# Patient Record
Sex: Male | Born: 1955 | Race: White | Hispanic: No | Marital: Married | Smoking: Never smoker
Health system: Southern US, Community
[De-identification: ages and names within clinical notes are randomized; demographics above are authoritative.]

## PROBLEM LIST (undated history)

## (undated) DIAGNOSIS — M5416 Radiculopathy, lumbar region: Secondary | ICD-10-CM

## (undated) HISTORY — PX: PARTIAL HIP ARTHROPLASTY: SHX733

## (undated) HISTORY — DX: Radiculopathy, lumbar region: M54.16

## (undated) HISTORY — PX: TONSILLECTOMY: SUR1361

---

## 2008-09-13 ENCOUNTER — Ambulatory Visit: Payer: Self-pay | Admitting: Family Medicine

## 2008-09-13 DIAGNOSIS — R109 Unspecified abdominal pain: Secondary | ICD-10-CM | POA: Insufficient documentation

## 2008-09-13 DIAGNOSIS — R197 Diarrhea, unspecified: Secondary | ICD-10-CM

## 2008-09-14 ENCOUNTER — Encounter: Payer: Self-pay | Admitting: Family Medicine

## 2008-09-15 LAB — CONVERTED CEMR LAB
Band Neutrophils: 0 % (ref 0–10)
Basophils Absolute: 0 10*3/uL (ref 0.0–0.1)
Basophils Relative: 0 % (ref 0–1)
Hemoglobin: 14.9 g/dL (ref 13.0–17.0)
Lymphocytes Relative: 31 % (ref 12–46)
MCHC: 34.3 g/dL (ref 30.0–36.0)
Neutro Abs: 4.1 10*3/uL (ref 1.7–7.7)
Platelets: 269 10*3/uL (ref 150–400)
WBC: 7.6 10*3/uL (ref 4.0–10.5)

## 2008-09-23 ENCOUNTER — Encounter: Payer: Self-pay | Admitting: Family Medicine

## 2010-01-31 ENCOUNTER — Ambulatory Visit: Payer: Self-pay | Admitting: Family Medicine

## 2010-01-31 DIAGNOSIS — J069 Acute upper respiratory infection, unspecified: Secondary | ICD-10-CM | POA: Insufficient documentation

## 2010-01-31 DIAGNOSIS — K219 Gastro-esophageal reflux disease without esophagitis: Secondary | ICD-10-CM | POA: Insufficient documentation

## 2010-02-01 ENCOUNTER — Encounter: Payer: Self-pay | Admitting: Family Medicine

## 2010-07-29 ENCOUNTER — Encounter: Payer: Self-pay | Admitting: Orthopedic Surgery

## 2010-08-07 NOTE — Letter (Signed)
Summary: Out of Work  [facility]  [address]   [place], [place] [address]   Phone: [phone]  Fax: [phone]    January 31, 2010   Employee:  [employer]    To Whom It May Concern:   For Medical reasons, please excuse the above named employee from work today and tomorrow.  If you need additional information, please feel free to contact our office.         Sincerely,    Donna Christen MD

## 2010-08-07 NOTE — Assessment & Plan Note (Signed)
Summary: SORE THROAT   Vital Signs:  Patient Profile:   55 Years Old Male CC:      sore throat X 1 week,  dry cough Height:     67 inches Weight:      157 pounds O2 Sat:      97 % O2 treatment:    Room Air Temp:     98.2 degrees F oral Pulse rate:   93 / minute Resp:     12 per minute BP sitting:   112 / 73  (right arm) Cuff size:   regular  Vitals Entered By: Lajean Saver RN (January 31, 2010 3:53 PM)                  Updated Prior Medication List: No Medications Current Allergies (reviewed today): No known allergies History of Present Illness Chief Complaint: sore throat X 1 week,  dry cough History of Present Illness:  Subjective: Patient complains of onset of sore throat one week ago followed by sinus congestion.  He developed a cough 3 days ago.  He has a history of GERD and notes that his throat has become more sore since his cough started.  His cough is worse at night.  He normally takes Omeprazole 10mg  once daily or every other day.   No pleuritic pain No wheezing + nasal congestion +  post-nasal drainage No sinus pain/pressure No itchy/red eyes No earache No hemoptysis No SOB No fever/chills No nausea No vomiting No abdominal pain No diarrhea No skin rashes + fatigue No myalgias No headache Used OTC meds without relief   REVIEW OF SYSTEMS Constitutional Symptoms      Denies fever, chills, night sweats, weight loss, weight gain, and fatigue.  Eyes       Denies change in vision, eye pain, eye discharge, glasses, contact lenses, and eye surgery. Ear/Nose/Throat/Mouth       Complains of sore throat.      Denies hearing loss/aids, change in hearing, ear pain, ear discharge, dizziness, frequent runny nose, frequent nose bleeds, sinus problems, hoarseness, and tooth pain or bleeding.  Respiratory       Complains of dry cough.      Denies productive cough, wheezing, shortness of breath, asthma, bronchitis, and emphysema/COPD.  Cardiovascular  Denies murmurs, chest pain, and tires easily with exhertion.    Gastrointestinal       Denies stomach pain, nausea/vomiting, diarrhea, constipation, blood in bowel movements, and indigestion. Genitourniary       Denies painful urination, kidney stones, and loss of urinary control. Neurological       Denies paralysis, seizures, and fainting/blackouts. Musculoskeletal       Denies muscle pain, joint pain, joint stiffness, decreased range of motion, redness, swelling, muscle weakness, and gout.  Skin       Denies bruising, unusual mles/lumps or sores, and hair/skin or nail changes.  Psych       Denies mood changes, temper/anger issues, anxiety/stress, speech problems, depression, and sleep problems.  Past History:  Past Medical History: throat closes spontaneously- undiagnosed, to schedule endoscopy  Past Surgical History: Reviewed history from 09/13/2008 and no changes required. left hip replacement 06/23/07  Family History: Reviewed history from 09/13/2008 and no changes required. mother deceased from heart attack father deceased from cancer  Social History: denies smoking, drinking or recreational drug use Never Smoked Alcohol use-no Drug use-no work in [employer] Status:  never Drug Use:  no   Objective:  Appearance:  Patient appears healthy, stated  age, and in no acute distress  Eyes:  Pupils are equal, round, and reactive to light and accomdation.  Extraocular movement is intact.  Conjunctivae are not inflamed.  Ears:  Canals normal.  Tympanic membranes normal.   Nose:  Normal septum.  Normal turbinates, mildly congested.  No sinus tenderness present.  Pharynx:  Mildly erythematous posteriorly Neck:  Supple.  No adenopathy is present.  No thyromegaly is present  Lungs:  Clear to auscultation.  Breath sounds are equal.  Heart:  Regular rate and rhythm without murmurs, rubs, or gallops.  Abdomen:  Nontender without masses or hepatosplenomegaly.  Bowel sounds are  present.  No CVA or flank tenderness.  Skin:  No rash Rapid strep test negative  Assessment New Problems: GERD (ICD-530.81) URI (ICD-465.9)  VIRAL URI GERD EXACERBATED BY COUGH  Plan New Medications/Changes: AZITHROMYCIN 250 MG TABS (AZITHROMYCIN) Two tabs by mouth on day 1, then 1 tab daily on days 2 through 5  (Rx void after 02/07/10)  #6 tabs x 0, 01/31/2010, Donna Christen MD BENZONATATE 200 MG CAPS (BENZONATATE) One by mouth hs as needed cough  #12 x 0, 01/31/2010, Donna Christen MD  New Orders: Est. Patient Level III [04540] Rapid Strep [98119] T-Culture, Throat [[id number]] Planning Comments:   Throat culture pending Treat symptomatically for now:  expectorant/decongestant, cough suprressant at bedtime, increase dose of omeprazole until URI resolved. Add Z-pack (given Rx to hold) if not improved 5 to 7 days or if fever develops.  Follow-up with PCP  Work/School Excuse: Return to work/school today  The patient and/or caregiver has been counseled thoroughly with regard to medications prescribed including dosage, schedule, interactions, rationale for use, and possible side effects and they verbalize understanding.  Diagnoses and expected course of recovery discussed and will return if not improved as expected or if the condition worsens. Patient and/or caregiver verbalized understanding.  Prescriptions: AZITHROMYCIN 250 MG TABS (AZITHROMYCIN) Two tabs by mouth on day 1, then 1 tab daily on days 2 through 5  (Rx void after 02/07/10)  #6 tabs x 0   Entered and Authorized by:   Donna Christen MD   Signed by:   Donna Christen MD on 01/31/2010   Method used:   Print then Give to Patient   RxID:   [id number] BENZONATATE 200 MG CAPS (BENZONATATE) One by mouth hs as needed cough  #12 x 0   Entered and Authorized by:   Donna Christen MD   Signed by:   Donna Christen MD on 01/31/2010   Method used:   Print then Give to Patient   RxID:   [id number]   Patient Instructions: 1)   May use Mucinex (guaifenesin) twice daily for congestion.  May take with Sudafed (or equivalent) 2)  Increase fluid intake, rest.    3)  May use Afrin nasal spray (or generic oxymetazoline) twice daily for about 5 days.  Also recommend using saline nasal spray several times daily and/or saline nasal irrigation. 4)  Add Azithromycin if not improved 5 to 7 days or fever develops. 5)  Increase Omeprazole 10mg  to twice daily for one week. 6)  Followup with family doctor if not improving 7 to 10 days.  Orders Added: 1)  Est. Patient Level III [44034] 2)  Rapid Strep [74259] 3)  T-Culture, Throat [[id number]]  Laboratory Results  Date/Time Received: January 31, 2010 4:49 PM  Date/Time Reported: January 31, 2010 4:49 PM   Other Tests  Rapid Strep: negative  Kit Test Internal QC: Negative   (  Normal Range: Negative)

## 2010-11-20 NOTE — H&P (Signed)
Robert Murillo, Robert Murillo                  ACCOUNT NO.:  [id number]   MEDICAL RECORD NO.:  [id number]        PATIENT TYPE:  LINP   LOCATION:                               FACILITY:  [facility]   PHYSICIAN:  Georges Lynch. Gioffre, M.D.DATE OF BIRTH:  1956-05-12   DATE OF ADMISSION:  05/28/2007  DATE OF DISCHARGE:                              HISTORY & PHYSICAL   CHIEF COMPLAINT:  Painful range of motion of left hip.   HISTORY OF PRESENT ILLNESS:  The patient is a 55 year old gentleman with  painful range of motion of his left hip.  Evaluation reveals severe  osteoarthritis of his left hip.  The patient has failed conservative  treatment and has elected to proceed with a total hip arthroplasty.   ALLERGIES:  No known drug allergies.   CURRENT MEDICATIONS:  1. Axert 12.5 mg p.r.n. migraines.  2. Etodolac 50 mg p.r.n.  3. Flexor patch p.r.n.  4. Ranitidine 150 mg p.o. daily.  5. Ibuprofen 800 mg p.r.n.  6. Famotidine 20 mg p.o. daily.   PAST MEDICAL HISTORY:  1. History of migraines.  2. GERD.  3. Osteoarthritis, left hip.   PAST SURGICAL HISTORY:  Tonsils without any complications.   PRIMARY CARE PHYSICIAN:  Dr. Marinda Elk   REVIEW OF SYSTEMS:  Negative for any neurologic other than related to  his migraines, and he only has those infrequently.  He denies any  pulmonary, cardiovascular, no GI problems other than related to his  reflux which is fairly well controlled with the ranitidine and  famotidine.  He denies any urinary issues, endocrine, or hematologic.   FAMILY MEDICAL HISTORY:  Mother is deceased from complications from  diabetes and an MI at the age of 67.  Father is deceased from lung  cancer at 87.  There is no smoking history in the family.   SOCIAL HISTORY:  The patient is married.  He is a [employer] manager.  He  has never smoked, no alcohol.  He lives with his wife in a 2-story  house.   PHYSICAL EXAM:  VITALS:  Height is 5 feet 7 inches; weight is 156; blood  pressure  is 118/82; pulse is 70 and regular; respirations 12; the  patient is afebrile.  GENERAL:  This is a healthy-appearing well-developed gentleman,  conscious, alert, and appropriate, ambulates with a fairly easy balanced  gait.  Appears to be in no distress.  HEENT:  Head was normocephalic.  Pupils equal, round, and reactive.  Extraocular movements intact.  Gross hearing is intact.  NECK:  Supple, no palpable lymphadenopathy, excellent range of motion.  CHEST:  Lung sounds were clear and equal bilaterally, and no wheezes,  rales, or rhonchi.  HEART:  Regular rate and rhythm, no murmurs.  ABDOMEN:  Soft, bowel sounds present.  The CVA region was nontender.  EXTREMITIES:  Upper extremities with excellent range of motion of his  shoulders, elbows, and wrists.  Motor strength is 5/5.  LOWER EXTREMITIES:  Right hip had full extension, flexion up to 130  degrees with 20 degrees internal and external rotation.  Left hip  had  full extension, flexion up to about 120 degrees before he had some  discomfort.  He had 0 degrees of internal rotation, about 5 degrees of  external rotation.  Both knees were symmetrical with excellent range of  motion without any discomfort.  Ankles were symmetrical with good range  of motion.  PERIPHERAL VASCULAR:  Carotid pulses were 2+, no bruits.  Radial pulses  were 2+.  Posterior tibial pulses were 2+.  He had no lower extremity  edema.  NEURO:  The patient was conscious, alert, and appropriate, good  historian.  He had no gross neurologic defects noted.  BREASTS/RECTAL/GU:  Deferred at this time.   IMPRESSION:  1. End-stage osteoarthritis, left hip.  2. History of migraines.  3. Gastroesophageal reflux disease.   PLAN:  The patient has been evaluated by his primary care physician and  cleared for this upcoming procedure.  The patient will undergo all  routine labs and tests prior to having a left total hip arthroplasty by  Dr. Darrelyn Hillock at [facility] on  November 20.      Jamelle Rushing, P.A.    ______________________________  Georges Lynch Darrelyn Hillock, M.D.    RWK/MEDQ  D:  05/18/2007  T:  05/18/2007  Job:  [id number]

## 2011-05-01 ENCOUNTER — Other Ambulatory Visit (HOSPITAL_BASED_OUTPATIENT_CLINIC_OR_DEPARTMENT_OTHER): Payer: Self-pay | Admitting: Family Medicine

## 2011-05-01 DIAGNOSIS — N508 Other specified disorders of male genital organs: Secondary | ICD-10-CM

## 2011-05-03 ENCOUNTER — Ambulatory Visit (INDEPENDENT_AMBULATORY_CARE_PROVIDER_SITE_OTHER)
Admission: RE | Admit: 2011-05-03 | Discharge: 2011-05-03 | Disposition: A | Discharge status: Home / Self Care | Payer: 59 | Source: Ambulatory Visit | Attending: Family Medicine | Admitting: Family Medicine

## 2011-05-03 ENCOUNTER — Ambulatory Visit (HOSPITAL_BASED_OUTPATIENT_CLINIC_OR_DEPARTMENT_OTHER)
Admission: RE | Admit: 2011-05-03 | Discharge: 2011-05-03 | Disposition: A | Discharge status: Home / Self Care | Payer: 59 | Source: Ambulatory Visit | Attending: Family Medicine | Admitting: Family Medicine

## 2011-05-03 ENCOUNTER — Other Ambulatory Visit (HOSPITAL_BASED_OUTPATIENT_CLINIC_OR_DEPARTMENT_OTHER): Payer: Self-pay | Admitting: Family Medicine

## 2011-05-03 DIAGNOSIS — N508 Other specified disorders of male genital organs: Secondary | ICD-10-CM

## 2013-05-09 ENCOUNTER — Emergency Department
Admission: EM | Admit: 2013-05-09 | Discharge: 2013-05-09 | Disposition: A | Discharge status: Other Acute Inpatient Hospital | Payer: BC Managed Care – PPO | Source: Home / Self Care

## 2013-05-09 NOTE — ED Notes (Signed)
Patient advised to go to ER b/c cat scratch Friday and is now having erythema streaking up forearm now has generalized chills.

## 2014-06-19 ENCOUNTER — Encounter: Payer: Self-pay | Admitting: *Deleted

## 2014-06-19 ENCOUNTER — Emergency Department
Admission: EM | Admit: 2014-06-19 | Discharge: 2014-06-19 | Disposition: A | Discharge status: Home / Self Care | Payer: BC Managed Care – PPO | Source: Home / Self Care | Attending: Emergency Medicine | Admitting: Emergency Medicine

## 2014-06-19 DIAGNOSIS — M5432 Sciatica, left side: Secondary | ICD-10-CM

## 2014-06-19 MED ORDER — MELOXICAM 7.5 MG PO TABS: ORAL_TABLET | ORAL | Status: DC

## 2014-06-19 MED ORDER — METHYLPREDNISOLONE ACETATE 80 MG/ML IJ SUSP
80.0000 mg | Freq: Once | INTRAMUSCULAR | Status: AC
  Administered 2014-06-19: 80 mg via INTRAMUSCULAR

## 2014-06-19 MED ORDER — HYDROCODONE-ACETAMINOPHEN 5-325 MG PO TABS: 1.0000 | ORAL_TABLET | Freq: Four times a day (QID) | ORAL | Status: DC | PRN

## 2014-06-19 MED ORDER — PREDNISONE 20 MG PO TABS: 20.0000 mg | ORAL_TABLET | Freq: Two times a day (BID) | ORAL | Status: DC

## 2014-06-19 NOTE — ED Provider Notes (Signed)
CSN: [id number]     Arrival date & time 06/19/14  1330 History   First MD Initiated Contact with Patient 06/19/14 1524     Chief Complaint  Patient presents with  . Leg Pain    Patient is a 58 y.o. male presenting with leg pain and back pain. The history is provided by the patient and the spouse.  Leg Pain Associated symptoms: back pain   Associated symptoms: no fever   Back Pain Location:  Gluteal region Quality:  Shooting, stabbing and burning Radiates to:  L knee Pain severity now: 7 out of 10 intensity. Pain is:  Same all the time Onset quality:  Unable to specify Chronicity:  New Context: twisting (or movement)   Context: not falling, not lifting heavy objects, not occupational injury and not recent injury   Ineffective treatments: Motrin. Associated symptoms: leg pain (left)   Associated symptoms: no abdominal pain, no bladder incontinence, no bowel incontinence, no chest pain, no dysuria, no fever, no numbness, no paresthesias, no tingling and no weakness    Pt had a massage 5 days ago and developed severe pain starting in his L hip/butt down to his ankle. He saw a chiropractor 2 days ago and was "adjusted", pain improved then, now worse today. 7/10 intensity  Has tried ice, heat, and motrin with no relief.  He recalls no specific trauma otherwise. History of left hip replacement years ago without sequelae. He states that x-rays of lumbar spine and left hip were taken within the past 2 months and were within normal limits. History reviewed. No pertinent past medical history. Past Surgical History  Procedure Laterality Date  . Partial hip replacemetn     History reviewed. No pertinent family history. History  Substance Use Topics  . Smoking status: Never Smoker   . Smokeless tobacco: Never Used  . Alcohol Use: No    Review of Systems  Constitutional: Negative for fever.  Cardiovascular: Negative for chest pain.  Gastrointestinal: Negative for abdominal pain and  bowel incontinence.  Genitourinary: Negative for bladder incontinence and dysuria.  Musculoskeletal: Positive for back pain.  Neurological: Negative for tingling, weakness, numbness and paresthesias.  All other systems reviewed and are negative.   Allergies  Review of patient's allergies indicates no known allergies.  Home Medications   Prior to Admission medications   Medication Sig Start Date End Date Taking? Authorizing Provider  ibuprofen (ADVIL,MOTRIN) 800 MG tablet Take 800 mg by mouth every 8 (eight) hours as needed.   Yes Historical Provider, MD  HYDROcodone-acetaminophen (NORCO/VICODIN) 5-325 MG per tablet Take 1-2 tablets by mouth every 6 (six) hours as needed for severe pain. Take with food. May cause drowsiness 06/19/14   Lajean Manesavid Massey, MD  meloxicam (MOBIC) 7.5 MG tablet Take 1 twice a day as needed for pain. Take with food. (Do not take with any other NSAID.) 06/19/14   Lajean Manesavid Massey, MD  predniSONE (DELTASONE) 20 MG tablet Take 1 tablet (20 mg total) by mouth 2 (two) times daily with a meal. 06/19/14   Lajean Manesavid Massey, MD   BP 145/98 mmHg  Pulse 96  Temp(Src) 98 F (36.7 C) (Oral)  Ht 5\' 8"  (1.727 m)  Wt 166 lb (75.297 kg)  BMI 25.25 kg/m2  SpO2 97% Physical Exam  Constitutional: He is oriented to person, place, and time. He appears well-developed and well-nourished. He is cooperative.  Non-toxic appearance. No distress.  Appears uncomfortable from L buttock pain  HENT:  Head: Normocephalic and atraumatic.  Mouth/Throat: Oropharynx  is clear and moist.  Eyes: EOM are normal. Pupils are equal, round, and reactive to light. No scleral icterus.  Neck: Neck supple.  Cardiovascular: Regular rhythm and normal heart sounds.   Pulmonary/Chest: Effort normal and breath sounds normal. No respiratory distress. He has no wheezes. He has no rales. He exhibits no tenderness.  Abdominal: Soft. There is no tenderness.  Musculoskeletal:       Right hip: Normal.       Left hip: Normal.        Cervical back: He exhibits no tenderness.       Thoracic back: He exhibits no tenderness.       Lumbar back: He exhibits decreased range of motion, tenderness (Left sciatic notch) and spasm (mild L paralumbar). He exhibits no bony tenderness, no swelling, no edema, no deformity, no laceration and normal pulse.       Back:  Negative Right straight leg-raise test. + Left straight leg-raise test.  Negative Right Luisa HartPatrick test. Negative Left Luisa HartPatrick test.    Neurological: He is alert and oriented to person, place, and time. He has normal strength. He displays no atrophy, no tremor and normal reflexes. No cranial nerve deficit or sensory deficit. He exhibits normal muscle tone. Gait normal.  Reflex Scores:      Patellar reflexes are 2+ on the right side and 2+ on the left side.      Achilles reflexes are 2+ on the right side and 2+ on the left side. Skin: Skin is warm, dry and intact. No lesion and no rash noted.  Psychiatric: He has a normal mood and affect.  Nursing note and vitals reviewed.  left leg: No calf tenderness. Negative Homans sign. Knee is nontender and functions normally. No tenderness  left iliac crest or greater trochanter ED Course  Procedures (including critical care time) Labs Review Labs Reviewed - No data to display  Imaging Review No results found.   MDM   1. Sciatica neuralgia, left    neurologic exam intact. No tenderness over lumbar spinous processes Workup and treatment options discussed. Although I offered, he and wife declined x-ray left hip and LS-spine. After risks, benefits, alternatives discussed, patient wife agreed to the following treatment plan: Depo-Medrol 80 mg IM stat Discharge Medication List as of 06/19/2014  3:31 PM    START taking these medications   Details  HYDROcodone-acetaminophen (NORCO/VICODIN) 5-325 MG per tablet Take 1-2 tablets by mouth every 6 (six) hours as needed for severe pain. Take with food. May cause drowsiness,  Starting 06/19/2014, Until Discontinued, Print    meloxicam (MOBIC) 7.5 MG tablet Take 1 twice a day as needed for pain. Take with food. (Do not take with any other NSAID.), Print    predniSONE (DELTASONE) 20 MG tablet Take 1 tablet (20 mg total) by mouth 2 (two) times daily with a meal., Starting 06/19/2014, Until Discontinued, Print       Heat, ice, other modalities discussed. Follow-up with your ortho in 5-7 days, or sooner if symptoms become worse. Precautions discussed. Red flags discussed.--ER if any red flags. See detailed Instructions in AVS, which were given to patient. Verbal instructions also given. Risks, benefits, and alternatives of treatment options discussed. Questions invited and answered. . Patient and wife voiced understanding and agreement.      Lajean Manesavid Massey, MD 06/19/14 [phone]

## 2014-06-19 NOTE — ED Notes (Signed)
Pt had a massage 5 days ago and developed sever pain starting in his L hip/butt down to his ankle. He saw a chiropractor 2 days ago and was adjusted but the pain has worsened. 10/10.  Has tried ice, heat, and motrin with no relief.

## 2014-06-19 NOTE — Discharge Instructions (Signed)
Sciatica °Sciatica is pain, weakness, numbness, or tingling along your sciatic nerve. The nerve starts in the lower back and runs down the back of each leg. Nerve damage or certain conditions pinch or put pressure on the sciatic nerve. This causes the pain, weakness, and other discomforts of sciatica. °HOME CARE  °· Only take medicine as told by your doctor. °· Apply ice to the affected area for 20 minutes. Do this 3-4 times a day for the first 48-72 hours. Then try heat in the same way. °· Exercise, stretch, or do your usual activities if these do not make your pain worse. °· Go to physical therapy as told by your doctor. °· Keep all doctor visits as told. °· Do not wear high heels or shoes that are not supportive. °· Get a firm mattress if your mattress is too soft to lessen pain and discomfort. °GET HELP RIGHT AWAY IF:  °· You cannot control when you poop (bowel movement) or pee (urinate). °· You have more weakness in your lower back, lower belly (pelvis), butt (buttocks), or legs. °· You have redness or puffiness (swelling) of your back. °· You have a burning feeling when you pee. °· You have pain that gets worse when you lie down. °· You have pain that wakes you from your sleep. °· Your pain is worse than past pain. °· Your pain lasts longer than 4 weeks. °· You are suddenly losing weight without reason. °MAKE SURE YOU:  °· Understand these instructions. °· Will watch this condition. °· Will get help right away if you are not doing well or get worse. °Document Released: 04/02/2008 Document Revised: 12/24/2011 Document Reviewed: 11/03/2011 °Exit[employer]® Patient Information ©2015 [employer]. This information is not intended to replace advice given to you by your health care provider. Make sure you discuss any questions you have with your health care provider. ° °

## 2015-06-17 ENCOUNTER — Emergency Department: Admission: EM | Admit: 2015-06-17 | Discharge: 2015-06-17 | Payer: BLUE CROSS/BLUE SHIELD | Source: Home / Self Care

## 2015-06-21 ENCOUNTER — Other Ambulatory Visit: Payer: Self-pay | Admitting: Physician Assistant

## 2015-06-21 DIAGNOSIS — M5416 Radiculopathy, lumbar region: Secondary | ICD-10-CM

## 2015-06-23 ENCOUNTER — Ambulatory Visit (INDEPENDENT_AMBULATORY_CARE_PROVIDER_SITE_OTHER): Payer: BLUE CROSS/BLUE SHIELD | Admitting: Neurology

## 2015-06-23 ENCOUNTER — Encounter: Payer: Self-pay | Admitting: *Deleted

## 2015-06-23 ENCOUNTER — Encounter: Payer: Self-pay | Admitting: Neurology

## 2015-06-23 VITALS — BP 127/81 | HR 68 | Ht 67.0 in | Wt 158.2 lb

## 2015-06-23 DIAGNOSIS — M5416 Radiculopathy, lumbar region: Secondary | ICD-10-CM | POA: Diagnosis not present

## 2015-06-23 MED ORDER — PREGABALIN 75 MG PO CAPS: 75.0000 mg | ORAL_CAPSULE | Freq: Two times a day (BID) | ORAL | Status: AC

## 2015-06-23 MED ORDER — MELOXICAM 15 MG PO TABS: 15.0000 mg | ORAL_TABLET | Freq: Every day | ORAL | Status: AC

## 2015-06-23 NOTE — Patient Instructions (Signed)
Overall you are doing fairly well but I do want to suggest a few things today:   Remember to drink plenty of fluid, eat healthy meals and do not skip any meals. Try to eat protein with a every meal and eat a healthy snack such as fruit or nuts in between meals. Try to keep a regular sleep-wake schedule and try to exercise daily, particularly in the form of walking, 20-30 minutes a day, if you can.   As far as your medications are concerned, I would like to suggest Start meloxicam daily. Then can add Lyrica. Stop Gabapentin.   As far as diagnostic testing: MRi of the lumbar spine pending  I would like to see you back after imaging if needed, sooner if we need to. Please call us with any interim questions, concerns, problems, updates or refill requests.   Please also call us for any test results so we can go over those with you on the phone.  My clinical assistant and will answer any of your questions and relay your messages to me and also relay most of my messages to you.   Our phone number is [phone]. We also have an after hours call service for urgent matters and there is a physician on-call for urgent questions. For any emergencies you know to call 911 or go to the nearest emergency room

## 2015-06-23 NOTE — Progress Notes (Addendum)
[facility]    Provider:  Dr Lucia GaskinsAhern Referring Provider: Marinda ElkFried, Robert, MD Primary Care Physician:  Lenora BoysFRIED, ROBERT L, MD  CC:  Low back pain  HPI:  Robert Murillo is a 59 y.o. male here as a referral from Dr. Foy GuadalajaraFried for lower back pain and radiculopathy. Past medical history of chronic low back pain with radiculopathy, sciatica, paresthesias of bilateral legs, migraine headaches, degenerative disc disease. Symptoms started years ago. Has been slowly progressive with severe acute increase in the last 3 weeks. 3 weeks ago he could hardly get out of bed. It just hit him. He saw his chiropractor and it made things worse. He has done one sessiion of physical therapy. Tried gabapentin. Tramadol makes him tired. Symptoms are worse in the Left > right leg. Pins and needles in his feet, hard to walk, worse with bending and walking. Radiation down the back of the legs all the ways to the bottom of the feet. His bottom of his feet feel numb and cold. No paresthesias or numbness in the hands. Pain is continuous and severe 10 out of 10, he is walking with a limp. Symptoms worse with sitting too long. Hurts to cough and sneeze. getitng up out of a chair is hard. No changes with bowel/bladder. Starts in the buttocks. If he moves wrong there is a searing pain down the back of his legs. No inciting events, insidious onset progressive. He is scheduled for an MRI of the lumbar spine Monday at 4 PM that was ordered by a different physician. No other focal neurologic deficits. Nothing seems to make the pain better either.   Reviewed notes, labs and imaging from outside physicians, which showed:  Patient was seen in the emergency room in December 2015 for sciatica pain. He described back pain, in the gluteal region, shooting stabbing and burning radiating to the left knee 7 out of 10 in intensity. Motrin was ineffective worsened with twisting or movement. No occupational injury or recent injury. Positive leg  left straight leg raise. He was given the Depo-Medrol 80 mg IM shot. He was started on Norco, meloxicam and a prednisone taper.  Reviewed [facility] physician's notes. Patient was seen 1128 or severe back pain. Pain radiating down both legs for about 2 weeks taking ibuprofen for pain. He was prescribed tramadol and it does nothing that makes him feel tired. Sees chiropractor about 3 times around which helps. Denies bowel or bladder control problems. Pain level VII out of 10 but can be 10 out of 10.  On CBC white blood cells were elevated. CMP was unremarkable with a creatinine of 0.89.  Review of Systems: Patient complains of symptoms per HPI as well as the following symptoms: feeling cold, spinning sensation, joint pain, joint swelling, cramps, aching, numbness, weakness, restless legs. Pertinent negatives per HPI. All others negative.   Social History   Social History  . Marital Status: Married    Spouse Name: Diane  . Number of Children: 0  . Years of Education: 16   Occupational History  . Time [employer]    Social History Main Topics  . Smoking status: Never Smoker   . Smokeless tobacco: Never Used  . Alcohol Use: No  . Drug Use: No  . Sexual Activity: Not on file   Other Topics Concern  . Not on file   Social History Narrative   Lives with wife   2 cups coffee every am    Family History  Problem Relation Age of  Onset  . Lung cancer Father   . Diabetes Mother   . Neuropathy Neg Hx     Past Medical History  Diagnosis Date  . Bilateral lumbar radiculopathy 06/25/2015    Past Surgical History  Procedure Laterality Date  . Partial hip arthroplasty    . Tonsillectomy      Current Outpatient Prescriptions  Medication Sig Dispense Refill  . diclofenac (VOLTAREN) 50 MG EC tablet Take 50 mg by mouth.  1  . ibuprofen (ADVIL,MOTRIN) 800 MG tablet Take 800 mg by mouth every 8 (eight) hours as needed.    . traMADol (ULTRAM) 50 MG tablet Take 50 mg by mouth as needed.  0  .  meloxicam (MOBIC) 15 MG tablet Take 1 tablet (15 mg total) by mouth daily. 30 tablet 6  . pregabalin (LYRICA) 75 MG capsule Take 1 capsule (75 mg total) by mouth 2 (two) times daily. 60 capsule 6   No current facility-administered medications for this visit.    Allergies as of 06/23/2015  . (No Known Allergies)    Vitals: BP 127/81 mmHg  Pulse 68  Ht  (1.702 m)  Wt 158 lb 3.2 oz (71.759 kg)  BMI 24.77 kg/m2 Last Weight:  Wt Readings from Last 1 Encounters:  06/23/15 158 lb 3.2 oz (71.759 kg)   Last Height:   Ht Readings from Last 1 Encounters:  06/23/15  (1.702 m)   Physical exam: Exam: Gen: NAD, conversant, well nourised, obese, well groomed                     CV: RRR, no MRG. No Carotid Bruits. No peripheral edema, warm, nontender Eyes: Conjunctivae clear without exudates or hemorrhage  Neuro: Detailed Neurologic Exam  Speech:    Speech is normal; fluent and spontaneous with normal comprehension.  Cognition:    The patient is oriented to person, place, and time;     recent and remote memory intact;     language fluent;     normal attention, concentration,     fund of knowledge Cranial Nerves:    The pupils are equal, round, and reactive to light. The fundi are normal and spontaneous venous pulsations are present. Visual fields are full to finger confrontation. Extraocular movements are intact. Trigeminal sensation is intact and the muscles of mastication are normal. The face is symmetric. The palate elevates in the midline. Hearing intact. Voice is normal. Shoulder shrug is normal. The tongue has normal motion without fasciculations.   Coordination:    Normal finger to nose and heel to shin. Normal rapid alternating movements.   Gait:    Heel-toe and tandem gait are normal.   Motor Observation:    No asymmetry, no atrophy, and no involuntary movements noted. Tone:    Normal muscle tone.    Posture:    Posture is normal. normal erect     Strength:    Strength is V/V in the upper and lower limbs.      Sensation: decreased pin prick lateral lower extremities.      Reflex Exam:  DTR's: Absent AJS. Asymmetric patellars, left hyporeflexic. othersie deep tendon reflexes in the upper and lower extremities are normal bilaterally.   Toes:    The toes are downgoing bilaterally.   Clonus:    Clonus is absent.       Assessment/Plan:  59 year old patient was likely lumbar radiculopathy. He has an MRI scheduled on Monday.  - If nerve root entrapment is found,  recommend referral to neurosurgery or orthopedic surgery. Continue physical therapy. -If MRI of the lumbar spine is not conclusive, recommend EMG nerve conduction study. He has an MRI of the lumbar spine scheduled on Monday, ordered by different physician.Monday at [facility] med center on [address]. Will request results.  - As far as your medications are concerned, I would like to suggest: Start meloxicam daily. Then can add Lyrica. Stop Gabapentin.  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 Chronic disc, endplate, and posterior element degeneration at those levels. Widespread mild to moderate multifactorial lumbar foraminal stenosis.  L2-L3: Disc desiccation. Circumferential disc osteophyte complex with mild retrolisthesis. Minimal to mild facet hypertrophy. Overall mild to moderate spinal stenosis. Mild left greater than right L2 foraminal stenosis primarily related to foraminal disc.  L3-L4: Disc desiccation and 6 conference showed disc bulge. Broad-based posterior component of disc. Mild facet hypertrophy. Mild spinal stenosis. Mild bilateral L3 foraminal stenosis primarily due to foraminal component of disc.  At L4-L5: Disc dislocation, circumferential disc bulge with left  eccentric broad-based superimposed central disc protrusion. Mild to moderate facet and ligamentum flavum hypertrophy. Severe spinal and left greater than right lateral recess stenosis. Mild-to-moderate bilateral L4 foraminal stenosis  L5-S1: Vacuum disc. Disc space loss. Circumferential disc osteophyte complex. Mild facet hypertrophy. No spinal lateral recess stenosis. Mild to moderate left and mild right L5 foraminal stenosis primarily due to endplate spurring. CC:Dr. Wilfrid Lund, MD  [facility] [address] [place], [place] [address]  Phone [phone] Fax [phone]

## 2015-06-25 ENCOUNTER — Encounter: Payer: Self-pay | Admitting: Neurology

## 2015-06-25 DIAGNOSIS — M5416 Radiculopathy, lumbar region: Secondary | ICD-10-CM

## 2015-06-25 HISTORY — DX: Radiculopathy, lumbar region: M54.16

## 2015-06-26 ENCOUNTER — Ambulatory Visit (INDEPENDENT_AMBULATORY_CARE_PROVIDER_SITE_OTHER): Payer: BLUE CROSS/BLUE SHIELD

## 2015-06-26 DIAGNOSIS — M4806 Spinal stenosis, lumbar region: Secondary | ICD-10-CM | POA: Diagnosis not present

## 2015-06-26 DIAGNOSIS — M5416 Radiculopathy, lumbar region: Secondary | ICD-10-CM

## 2015-06-27 ENCOUNTER — Telehealth: Payer: Self-pay | Admitting: *Deleted

## 2015-06-27 NOTE — Telephone Encounter (Signed)
Release fax to Dr. Andria MeuseStevens requesting MRI report.

## 2015-06-28 NOTE — Telephone Encounter (Signed)
Pt called requesting MRI results. I relayed release was faxed yesterday and provider would call once it was rec'd. Pt sts he will call that office also

## 2015-06-29 ENCOUNTER — Other Ambulatory Visit: Payer: Self-pay | Admitting: *Deleted

## 2015-06-29 ENCOUNTER — Other Ambulatory Visit: Payer: Self-pay | Admitting: Neurology

## 2015-06-29 DIAGNOSIS — M48061 Spinal stenosis, lumbar region without neurogenic claudication: Secondary | ICD-10-CM

## 2015-06-29 NOTE — Telephone Encounter (Signed)
Dr Lucia GaskinsAhern- received results. Put with your notes from today, thank you

## 2015-06-29 NOTE — Telephone Encounter (Signed)
Spoke to patient, please send a referral to Dr. Ethelene Halamos. He has to bring a CD and we can fax the MRI results with the consult thanks

## 2015-07-04 NOTE — Telephone Encounter (Signed)
Noted all information has been sent and I spoke to the patient.

## 2015-07-24 ENCOUNTER — Telehealth: Payer: Self-pay | Admitting: *Deleted

## 2015-07-24 NOTE — Telephone Encounter (Signed)
A  return request from Dr. Viviann SpareSteven office no dates of treatment for this patient.

## 2015-08-24 ENCOUNTER — Emergency Department
Admission: EM | Admit: 2015-08-24 | Discharge: 2015-08-24 | Disposition: A | Discharge status: Home / Self Care | Payer: Managed Care, Other (non HMO) | Source: Home / Self Care | Attending: Family Medicine | Admitting: Family Medicine

## 2015-08-24 ENCOUNTER — Encounter: Payer: Self-pay | Admitting: *Deleted

## 2015-08-24 DIAGNOSIS — J069 Acute upper respiratory infection, unspecified: Secondary | ICD-10-CM | POA: Diagnosis not present

## 2015-08-24 DIAGNOSIS — H9202 Otalgia, left ear: Secondary | ICD-10-CM | POA: Diagnosis not present

## 2015-08-24 DIAGNOSIS — R258 Other abnormal involuntary movements: Secondary | ICD-10-CM | POA: Diagnosis not present

## 2015-08-24 DIAGNOSIS — R253 Fasciculation: Secondary | ICD-10-CM

## 2015-08-24 DIAGNOSIS — J019 Acute sinusitis, unspecified: Secondary | ICD-10-CM | POA: Diagnosis not present

## 2015-08-24 MED ORDER — BENZONATATE 100 MG PO CAPS: 100.0000 mg | ORAL_CAPSULE | Freq: Three times a day (TID) | ORAL | Status: AC

## 2015-08-24 MED ORDER — SALINE SPRAY 0.65 % NA SOLN: 1.0000 | NASAL | Status: AC | PRN

## 2015-08-24 MED ORDER — DOXYCYCLINE HYCLATE 100 MG PO CAPS: 100.0000 mg | ORAL_CAPSULE | Freq: Two times a day (BID) | ORAL | Status: AC

## 2015-08-24 NOTE — ED Notes (Signed)
Pt reports 6 days ago started aches, non-productive cough and fever. T-max 103 4 days ago. Received flu vac. A few nights ago developed arm twitching @ night.

## 2015-08-24 NOTE — ED Provider Notes (Signed)
CSN: [id number]     Arrival date & time 08/24/15  1113 History   First MD Initiated Contact with Patient 08/24/15 1134     Chief Complaint  Patient presents with  . Cough  . Generalized Body Aches   (Consider location/radiation/quality/duration/timing/severity/associated sxs/prior Treatment) HPI The pt is a 60yo male presenting to [facility] with c/o 6 days of URI symptoms with associated body aches, moderately intermittent non-productive cough, fever Tmax 103*F 4 days ago.  He has been taking OTC cough medication and alka-seltzer with only temporary relief. He also notes his upper arms have had intermittent twitching and muscle spasms that occur at night. Symptoms started about 2 days before the URI symptoms.  Others at work have also been sick.  Denies n/v/d. He did receive the flu vaccine this year. Denies chest pain or SOB. No hx of asthma or COPD. He does have a PCP who he f/u with at least annually.   Past Medical History  Diagnosis Date  . Bilateral lumbar radiculopathy 06/25/2015   Past Surgical History  Procedure Laterality Date  . Partial hip arthroplasty    . Tonsillectomy     Family History  Problem Relation Age of Onset  . Lung cancer Father   . Diabetes Mother   . Neuropathy Neg Hx    Social History  Substance Use Topics  . Smoking status: Never Smoker   . Smokeless tobacco: Never Used  . Alcohol Use: No    Review of Systems  Constitutional: Positive for fever, chills and fatigue.  HENT: Positive for congestion, ear pain (bilateral, pressure), hearing loss ( "muffled"), rhinorrhea and sinus pressure. Negative for sore throat, trouble swallowing and voice change.   Respiratory: Positive for cough and wheezing. Negative for shortness of breath.   Cardiovascular: Negative for chest pain and palpitations.  Gastrointestinal: Negative for nausea, vomiting, abdominal pain and diarrhea.  Musculoskeletal: Positive for myalgias and arthralgias. Negative for back pain.  Skin:  Negative for rash.    Allergies  Codeine  Home Medications   Prior to Admission medications   Medication Sig Start Date End Date Taking? Authorizing Provider  benzonatate (TESSALON) 100 MG capsule Take 1 capsule (100 mg total) by mouth every 8 (eight) hours. 08/24/15   Junius Finner, PA-C  diclofenac (VOLTAREN) 50 MG EC tablet Take 50 mg by mouth. 03/29/15   Historical Provider, MD  doxycycline (VIBRAMYCIN) 100 MG capsule Take 1 capsule (100 mg total) by mouth 2 (two) times daily. One po bid x 7 days 08/24/15 08/31/15  Junius Finner, PA-C  ibuprofen (ADVIL,MOTRIN) 800 MG tablet Take 800 mg by mouth every 8 (eight) hours as needed.    Historical Provider, MD  meloxicam (MOBIC) 15 MG tablet Take 1 tablet (15 mg total) by mouth daily. 06/23/15   Anson Fret, MD  pregabalin (LYRICA) 75 MG capsule Take 1 capsule (75 mg total) by mouth 2 (two) times daily. 06/23/15   Anson Fret, MD  sodium chloride (OCEAN) 0.65 % SOLN nasal spray Place 1 spray into both nostrils as needed. 08/24/15   Junius Finner, PA-C  traMADol (ULTRAM) 50 MG tablet Take 50 mg by mouth as needed. 06/05/15   Historical Provider, MD   Meds Ordered and Administered this Visit  Medications - No data to display  BP 137/87 mmHg  Pulse 97  Temp(Src) 98.4 F (36.9 C) (Oral)  Resp 14  Wt 146 lb (66.225 kg)  SpO2 96% No data found.   Physical Exam  Constitutional: He appears  well-developed and well-nourished.  HENT:  Head: Normocephalic and atraumatic.  Right Ear: Tympanic membrane is not erythematous and not bulging. A middle ear effusion is present.  Left Ear: Tympanic membrane is not erythematous and not bulging. A middle ear effusion is present.  Nose: Mucosal edema and rhinorrhea present. Right sinus exhibits maxillary sinus tenderness and frontal sinus tenderness. Left sinus exhibits maxillary sinus tenderness and frontal sinus tenderness.  Mouth/Throat: Uvula is midline and mucous membranes are normal. Posterior  oropharyngeal erythema present. No oropharyngeal exudate, posterior oropharyngeal edema or tonsillar abscesses.  Eyes: Conjunctivae are normal. No scleral icterus.  Neck: Normal range of motion. Neck supple.  Cardiovascular: Normal rate, regular rhythm and normal heart sounds.   Pulmonary/Chest: Effort normal and breath sounds normal. No respiratory distress. He has no wheezes. He has no rales. He exhibits no tenderness.  Intermittent productive cough on exam. No respiratory distress. Lungs: CTAB  Abdominal: Soft. He exhibits no distension. There is no tenderness.  Musculoskeletal: Normal range of motion.  Neurological: He is alert.  Skin: Skin is warm and dry.  Nursing note and vitals reviewed.   ED Course  Procedures (including critical care time)  Labs Review Labs Reviewed  COMPLETE METABOLIC PANEL WITH GFR  CBC WITH DIFFERENTIAL/PLATELET    Imaging Review No results found.    MDM   1. Muscle twitching   2. Acute upper respiratory infection   3. Acute rhinosinusitis   4. Otalgia, left    Pt c/o 1 week of persistent nasal congestion cough, and sinus pressure.   Reports of Temp max 103*F  Pt also c/o muscle twitching that started prior to URI symptoms. No rashes.   Will tx for bacterial sinusitis CBC and BMP pending. Labs ordered to help determine cause of muscle twitching. Advised pt labs should come back in 2-3 days, if normal, f/u with PCP for further workup of muscle twitching.  Advised pt to use acetaminophen and ibuprofen as needed for fever and pain. Encouraged rest and fluids. F/u with PCP in 1 week if not improving, sooner if worsening. Pt verbalized understanding and agreement with tx plan.   Junius Finner, PA-C 08/24/15 1218

## 2015-08-24 NOTE — Discharge Instructions (Signed)
You may take 400-600mg Ibuprofen (Motrin) every 6-8 hours for fever and pain  °Alternate with Tylenol  °You may take 500mg Tylenol every 4-6 hours as needed for fever and pain  °Follow-up with your primary care provider next week for recheck of symptoms if not improving.  °Be sure to drink plenty of fluids and rest, at least 8hrs of sleep a night, preferably more while you are sick. °Return urgent care or go to closest ER if you cannot keep down fluids/signs of dehydration, fever not reducing with Tylenol, difficulty breathing/wheezing, stiff neck, worsening condition, or other concerns (see below)  °Please take antibiotics as prescribed and be sure to complete entire course even if you start to feel better to ensure infection does not come back. ° ° °Cool Mist Vaporizers °Vaporizers may help relieve the symptoms of a cough and cold. They add moisture to the air, which helps mucus to become thinner and less sticky. This makes it easier to breathe and cough up secretions. Cool mist vaporizers do not cause serious burns like hot mist vaporizers, which may also be called steamers or humidifiers. Vaporizers have not been proven to help with colds. You should not use a vaporizer if you are allergic to mold. °HOME CARE INSTRUCTIONS °· Follow the package instructions for the vaporizer. °· Do not use anything other than distilled water in the vaporizer. °· Do not run the vaporizer all of the time. This can cause mold or bacteria to grow in the vaporizer. °· Clean the vaporizer after each time it is used. °· Clean and dry the vaporizer well before storing it. °· Stop using the vaporizer if worsening respiratory symptoms develop. °  °This information is not intended to replace advice given to you by your health care provider. Make sure you discuss any questions you have with your health care provider. °  °Document Released: 03/21/2004 Document Revised: 06/29/2013 Document Reviewed: 11/11/2012 °Elsevier Interactive Patient  Education ©2016 [employer] ° °

## 2015-08-25 ENCOUNTER — Telehealth: Payer: Self-pay | Admitting: *Deleted

## 2015-08-25 LAB — CBC WITH DIFFERENTIAL/PLATELET
Basophils Absolute: 0.1 10*3/uL (ref 0.0–0.1)
Basophils Relative: 1 % (ref 0–1)
Eosinophils Absolute: 0.2 10*3/uL (ref 0.0–0.7)
Eosinophils Relative: 2 % (ref 0–5)
HCT: 43.3 % (ref 39.0–52.0)
Hemoglobin: 15.1 g/dL (ref 13.0–17.0)
Lymphocytes Relative: 20 % (ref 12–46)
Lymphs Abs: 1.7 10*3/uL (ref 0.7–4.0)
MCH: 30.1 pg (ref 26.0–34.0)
MCHC: 34.9 g/dL (ref 30.0–36.0)
MCV: 86.4 fL (ref 78.0–100.0)
MPV: 9.2 fL (ref 8.6–12.4)
Monocytes Absolute: 0.9 10*3/uL (ref 0.1–1.0)
Monocytes Relative: 11 % (ref 3–12)
Neutro Abs: 5.6 10*3/uL (ref 1.7–7.7)
Neutrophils Relative %: 66 % (ref 43–77)
Platelets: 225 10*3/uL (ref 150–400)
RBC: 5.01 MIL/uL (ref 4.22–5.81)
RDW: 13.3 % (ref 11.5–15.5)
WBC: 8.5 10*3/uL (ref 4.0–10.5)

## 2015-08-25 LAB — COMPLETE METABOLIC PANEL WITH GFR
ALT: 25 U/L (ref 9–46)
AST: 24 U/L (ref 10–35)
Albumin: 4.3 g/dL (ref 3.6–5.1)
Alkaline Phosphatase: 29 U/L — ABNORMAL LOW (ref 40–115)
BUN: 19 mg/dL (ref 7–25)
CO2: 24 mmol/L (ref 20–31)
Calcium: 8.9 mg/dL (ref 8.6–10.3)
Chloride: 104 mmol/L (ref 98–110)
Creat: 0.81 mg/dL (ref 0.70–1.33)
GFR, Est African American: >89 mL/min (ref >=60)
GFR, Est Non African American: >89 mL/min (ref >=60)
Glucose, Bld: 100 mg/dL — ABNORMAL HIGH (ref 65–99)
Potassium: 4 mmol/L (ref 3.5–5.3)
Sodium: 141 mmol/L (ref 135–146)
Total Bilirubin: 0.4 mg/dL (ref 0.2–1.2)
Total Protein: 6.7 g/dL (ref 6.1–8.1)

## 2015-12-15 ENCOUNTER — Encounter: Payer: Self-pay | Admitting: Emergency Medicine

## 2015-12-15 ENCOUNTER — Emergency Department
Admission: EM | Admit: 2015-12-15 | Discharge: 2015-12-15 | Disposition: A | Discharge status: Other Acute Inpatient Hospital | Payer: Managed Care, Other (non HMO) | Source: Home / Self Care | Attending: Family Medicine | Admitting: Family Medicine

## 2015-12-15 DIAGNOSIS — R42 Dizziness and giddiness: Secondary | ICD-10-CM

## 2015-12-15 DIAGNOSIS — R509 Fever, unspecified: Secondary | ICD-10-CM | POA: Diagnosis not present

## 2015-12-15 DIAGNOSIS — R51 Headache: Secondary | ICD-10-CM

## 2015-12-15 DIAGNOSIS — R519 Headache, unspecified: Secondary | ICD-10-CM

## 2015-12-15 LAB — POCT URINALYSIS DIP (MANUAL ENTRY)
Bilirubin, UA: NEGATIVE
Glucose, UA: 250 — AB
Ketones, POC UA: NEGATIVE
Leukocytes, UA: NEGATIVE
NITRITE UA: NEGATIVE
Protein Ur, POC: 100 — AB
SPEC GRAV UA: 1.025 (ref 1.005–1.03)
UROBILINOGEN UA: 0.2 (ref 0–1)
pH, UA: 6 (ref 5–8)

## 2015-12-15 LAB — POCT FASTING CBG KUC MANUAL ENTRY: POCT GLUCOSE (MANUAL ENTRY) KUC: 163 mg/dL — AB (ref 70–99)

## 2015-12-15 MED ORDER — ONDANSETRON HCL 4 MG PO TABS
4.0000 mg | ORAL_TABLET | Freq: Once | ORAL | Status: AC
  Administered 2015-12-15: 4 mg via ORAL

## 2015-12-15 MED ORDER — IBUPROFEN 600 MG PO TABS
600.0000 mg | ORAL_TABLET | Freq: Once | ORAL | Status: AC
  Administered 2015-12-15: 600 mg via ORAL

## 2015-12-15 NOTE — ED Provider Notes (Signed)
CSN: [id number]     Arrival date & time 12/15/15  1609 History   First MD Initiated Contact with Patient 12/15/15 1641     Chief Complaint  Patient presents with  . Dizziness  . Generalized Body Aches      HPI Comments: Yesterday morning patient began to feel fatigued, later becoming dizzy (head spinning).  He became increasingly fatigued through the night, developing myalgias, chills, posterior headache, and nausea without vomiting.  No respiratory or GU symptoms.  No diarrhea.  The history is provided by the patient and the spouse.    Past Medical History  Diagnosis Date  . Bilateral lumbar radiculopathy 06/25/2015   Past Surgical History  Procedure Laterality Date  . Partial hip arthroplasty    . Tonsillectomy     Family History  Problem Relation Age of Onset  . Lung cancer Father   . Diabetes Mother   . Neuropathy Neg Hx    Social History  Substance Use Topics  . Smoking status: Never Smoker   . Smokeless tobacco: Never Used  . Alcohol Use: No    Review of Systems No sore throat No cough No pleuritic pain No wheezing No nasal congestion No post-nasal drainage No sinus pain/pressure No itchy/red eyes No earache + dizzy No hemoptysis No SOB ? fever, + chills + nausea No vomiting No abdominal pain No diarrhea No urinary symptoms No skin rash + fatigue + myalgias + headache Used OTC meds without relief  Allergies  Codeine  Home Medications   Prior to Admission medications   Medication Sig Start Date End Date Taking? Authorizing Provider  benzonatate (TESSALON) 100 MG capsule Take 1 capsule (100 mg total) by mouth every 8 (eight) hours. 08/24/15   Junius FinnerErin O'Malley, PA-C  diclofenac (VOLTAREN) 50 MG EC tablet Take 50 mg by mouth. 03/29/15   Historical Provider, MD  ibuprofen (ADVIL,MOTRIN) 800 MG tablet Take 800 mg by mouth every 8 (eight) hours as needed.    Historical Provider, MD  meloxicam (MOBIC) 15 MG tablet Take 1 tablet (15 mg total) by mouth  daily. 06/23/15   Anson FretAntonia B Ahern, MD  pregabalin (LYRICA) 75 MG capsule Take 1 capsule (75 mg total) by mouth 2 (two) times daily. 06/23/15   Anson FretAntonia B Ahern, MD  sodium chloride (OCEAN) 0.65 % SOLN nasal spray Place 1 spray into both nostrils as needed. 08/24/15   Junius FinnerErin O'Malley, PA-C  traMADol (ULTRAM) 50 MG tablet Take 50 mg by mouth as needed. 06/05/15   Historical Provider, MD   Meds Ordered and Administered this Visit   Medications  ibuprofen (ADVIL,MOTRIN) tablet 600 mg (600 mg Oral Given 12/15/15 1700)  ondansetron (ZOFRAN) tablet 4 mg (4 mg Oral Given 12/15/15 1710)    BP 108/63 mmHg  Pulse 107  Temp(Src) 102.9 F (39.4 C) (Oral)  Ht 5\' 7"  (1.702 m)  Wt 155 lb (70.308 kg)  BMI 24.27 kg/m2  SpO2 94% No data found.   Physical Exam Nursing notes and Vital Signs reviewed. Appearance:  Patient appears stated age.  He appears uncomfortable but in no acute distress.  He is alert and oriented.  Eyes:  Pupils are equal, round, and reactive to light and accomodation.  Extraocular movement is intact.  Conjunctivae are not inflamed.  Fundi benign.  No nystagmus  Ears:  Canals normal.  Tympanic membranes normal.  Nose:  Normal turbinates.  No sinus tenderness.    Pharynx:  Normal; moist mucous membranes  Neck:  Tenderness posterior neck and occipital  area; has pain with neck flexion.  No adenopathy Lungs:  Clear to auscultation.  Breath sounds are equal.  Moving air well. Heart:  Regular rate and rhythm without murmurs, rubs, or gallops.  Rate 107 Abdomen:  Nontender without masses or hepatosplenomegaly.  Bowel sounds are present.  No CVA or flank tenderness.  Extremities:  No edema.  Skin:  No rash present. Neurologic:  Cranial nerves 2 through 12 are normal.  Patellar, achilles, and elbow reflexes are normal.  Cerebellar function is intact (finger-to-nose and rapid alternating hand movement).    ED Course  Procedures none    Labs Reviewed  POCT URINALYSIS DIP (MANUAL ENTRY) -  Abnormal; Notable for the following:    Glucose, UA =250 (*)    Blood, UA trace-intact (*)    Protein Ur, POC =100 (*)    All other components within normal limits  POCT FASTING CBG K[facility] MANUAL ENTRY - Abnormal; Notable for the following:    POCT Glucose ([facility]) 163 (*)    All other components within normal limits POCT CBC:  WBC 21.7; LY 1.7; MO 0.9; GR 97.4; Hgb 14.2; Platelets 181      MDM   1. Vertigo   2. Occipital headache   3. Fever in adult    Note significant leukocytosis and fever.  Concern for possible meningitis. Advised to proceed to local ER for further evaluation.  Vital signs stable.  May proceed by private vehicle (wife to drive).    Lattie Haw, MD 12/23/15 [phone]

## 2015-12-15 NOTE — Discharge Instructions (Signed)

## 2015-12-15 NOTE — ED Notes (Signed)
Reports starting to feel dizzy and weak last evening, progressing over the night to include extreme fatigue. Denies respiratory or gastrointestinal symptoms.

## 2015-12-16 ENCOUNTER — Telehealth: Payer: Self-pay | Admitting: Emergency Medicine

## 2016-01-09 ENCOUNTER — Emergency Department (HOSPITAL_COMMUNITY)
Admission: EM | Admit: 2016-01-09 | Discharge: 2016-01-09 | Disposition: A | Discharge status: Home / Self Care | Payer: Managed Care, Other (non HMO) | Attending: Emergency Medicine | Admitting: Emergency Medicine

## 2016-01-09 ENCOUNTER — Encounter (HOSPITAL_COMMUNITY): Payer: Self-pay | Admitting: Emergency Medicine

## 2016-01-09 DIAGNOSIS — M4800 Spinal stenosis, site unspecified: Secondary | ICD-10-CM

## 2016-01-09 DIAGNOSIS — M5441 Lumbago with sciatica, right side: Secondary | ICD-10-CM

## 2016-01-09 DIAGNOSIS — M549 Dorsalgia, unspecified: Secondary | ICD-10-CM | POA: Diagnosis present

## 2016-01-09 DIAGNOSIS — Z79899 Other long term (current) drug therapy: Secondary | ICD-10-CM | POA: Insufficient documentation

## 2016-01-09 DIAGNOSIS — E871 Hypo-osmolality and hyponatremia: Secondary | ICD-10-CM

## 2016-01-09 LAB — CBC WITH DIFFERENTIAL/PLATELET
BASOS ABS: 0.1 10*3/uL (ref 0.0–0.1)
Basophils Relative: 1 %
Eosinophils Absolute: 0.2 10*3/uL (ref 0.0–0.7)
Eosinophils Relative: 1 %
HCT: 36 % — ABNORMAL LOW (ref 39.0–52.0)
Hemoglobin: 11.8 g/dL — ABNORMAL LOW (ref 13.0–17.0)
Lymphocytes Relative: 10 %
Lymphs Abs: 1.1 10*3/uL (ref 0.7–4.0)
MCH: 28.6 pg (ref 26.0–34.0)
MCHC: 32.8 g/dL (ref 30.0–36.0)
MCV: 87.2 fL (ref 78.0–100.0)
MONO ABS: 1.1 10*3/uL — AB (ref 0.1–1.0)
Monocytes Relative: 10 %
NEUTROS ABS: 8.6 10*3/uL — AB (ref 1.7–7.7)
Neutrophils Relative %: 78 %
Platelets: 270 10*3/uL (ref 150–400)
RBC: 4.13 MIL/uL — AB (ref 4.22–5.81)
RDW: 12.3 % (ref 11.5–15.5)
WBC: 11 10*3/uL — ABNORMAL HIGH (ref 4.0–10.5)

## 2016-01-09 LAB — COMPREHENSIVE METABOLIC PANEL
ALK PHOS: 36 U/L — AB (ref 38–126)
ALT: 20 U/L (ref 17–63)
AST: 35 U/L (ref 15–41)
Albumin: 3.3 g/dL — ABNORMAL LOW (ref 3.5–5.0)
Anion gap: 9 (ref 5–15)
BILIRUBIN TOTAL: 0.6 mg/dL (ref 0.3–1.2)
BUN: 14 mg/dL (ref 6–20)
CALCIUM: 8.8 mg/dL — AB (ref 8.9–10.3)
CO2: 23 mmol/L (ref 22–32)
CREATININE: 0.83 mg/dL (ref 0.61–1.24)
Chloride: 96 mmol/L — ABNORMAL LOW (ref 101–111)
Glucose, Bld: 109 mg/dL — ABNORMAL HIGH (ref 65–99)
Potassium: 3.9 mmol/L (ref 3.5–5.1)
SODIUM: 128 mmol/L — AB (ref 135–145)
TOTAL PROTEIN: 7 g/dL (ref 6.5–8.1)

## 2016-01-09 LAB — SEDIMENTATION RATE: Sed Rate: 61 mm/hr — ABNORMAL HIGH (ref 0–16)

## 2016-01-09 LAB — C-REACTIVE PROTEIN: CRP: 22.8 mg/dL — AB (ref <1.0)

## 2016-01-09 LAB — I-STAT CG4 LACTIC ACID, ED: Lactic Acid, Venous: 0.89 mmol/L (ref 0.5–1.9)

## 2016-01-09 MED ORDER — KETOROLAC TROMETHAMINE 60 MG/2ML IM SOLN
30.0000 mg | Freq: Once | INTRAMUSCULAR | Status: AC
  Administered 2016-01-09: 30 mg via INTRAMUSCULAR
  Filled 2016-01-09: qty 2

## 2016-01-09 MED ORDER — OXYCODONE-ACETAMINOPHEN 5-325 MG PO TABS: 1.0000 | ORAL_TABLET | ORAL | Status: AC | PRN

## 2016-01-09 MED ORDER — OXYCODONE-ACETAMINOPHEN 5-325 MG PO TABS
1.0000 | ORAL_TABLET | Freq: Once | ORAL | Status: AC
  Administered 2016-01-09: 1 via ORAL
  Filled 2016-01-09: qty 1

## 2016-01-09 MED ORDER — DIAZEPAM 5 MG PO TABS
5.0000 mg | ORAL_TABLET | Freq: Once | ORAL | Status: AC
  Administered 2016-01-09: 5 mg via ORAL
  Filled 2016-01-09: qty 1

## 2016-01-09 NOTE — Discharge Instructions (Signed)
Continue to take Flexeril for muscle spasms. Take Percocet for severe pain only. Try heating pads. Please follow-up with neurosurgery, as referred. Follow-up with family doctor for fevers. Return if worsening symptoms.   Degenerative Disk Disease Degenerative disk disease is a condition caused by the changes that occur in spinal disks as you grow older. Spinal disks are soft and compressible disks located between the bones of your spine (vertebrae). These disks act like shock absorbers. Degenerative disk disease can affect the whole spine. However, the neck and lower back are most commonly affected. Many changes can occur in the spinal disks with aging, such as:  The spinal disks may dry and shrink.  Small tears may occur in the tough, outer covering of the disk (annulus).  The disk space may become smaller due to loss of water.  Abnormal growths in the bone (spurs) may occur. This can put pressure on the nerve roots exiting the spinal canal, causing pain.  The spinal canal may become narrowed. RISK FACTORS   Being overweight.  Having a family history of degenerative disk disease.  Smoking.  There is increased risk if you are doing heavy lifting or have a sudden injury. SIGNS AND SYMPTOMS  Symptoms vary from person to person and may include:  Pain that varies in intensity. Some people have no pain, while others have severe pain. The location of the pain depends on the part of your backbone that is affected.  You will have neck or arm pain if a disk in the neck area is affected.  You will have pain in your back, buttocks, or legs if a disk in the lower back is affected.  Pain that becomes worse while bending, reaching up, or with twisting movements.  Pain that may start gradually and then get worse as time passes. It may also start after a major or minor injury.  Numbness or tingling in the arms or legs. DIAGNOSIS  Your health care provider will ask you about your symptoms and  about activities or habits that may cause the pain. He or she may also ask about any injuries, diseases, or treatments you have had. Your health care provider will examine you to check for the range of movement that is possible in the affected area, to check for strength in your extremities, and to check for sensation in the areas of the arms and legs supplied by different nerve roots. You may also have:   An X-ray of the spine.  Other imaging tests, such as MRI. TREATMENT  Your health care provider will advise you on the best plan for treatment. Treatment may include:  Medicines.  Rehabilitation exercises. HOME CARE INSTRUCTIONS   Follow proper lifting and walking techniques as advised by your health care provider.  Maintain good posture.  Exercise regularly as advised by your health care provider.  Perform relaxation exercises.  Change your sitting, standing, and sleeping habits as advised by your health care provider.  Change positions frequently.  Lose weight or maintain a healthy weight as advised by your health care provider.  Do not use any tobacco products, including cigarettes, chewing tobacco, or electronic cigarettes. If you need help quitting, ask your health care provider.  Wear supportive footwear.  Take medicines only as directed by your health care provider. SEEK MEDICAL CARE IF:   Your pain does not go away within 1-4 weeks.  You have significant appetite or weight loss. SEEK IMMEDIATE MEDICAL CARE IF:   Your pain is severe.  You  notice weakness in your arms, hands, or legs.  You begin to lose control of your bladder or bowel movements.  You have fevers or night sweats. MAKE SURE YOU:   Understand these instructions.  Will watch your condition.  Will get help right away if you are not doing well or get worse.   This information is not intended to replace advice given to you by your health care provider. Make sure you discuss any questions you  have with your health care provider.   Document Released: 04/21/2007 Document Revised: 07/15/2014 Document Reviewed: 10/26/2013 Elsevier Interactive Patient Education [employer]2016 [employer] Inc.

## 2016-01-09 NOTE — ED Notes (Signed)
Patient denies any change in Bowel or bladder with back pain. States was in Hospital and the Hospital beds caused his back to spasm.

## 2016-01-09 NOTE — ED Provider Notes (Signed)
CSN: [id number]     Arrival date & time 01/09/16  1332 History   First MD Initiated Contact with Patient 01/09/16 1343     Chief Complaint  Patient presents with  . Back Pain     (Consider location/radiation/quality/duration/timing/severity/associated sxs/prior Treatment) HPI Robert Murillo is a 60 y.o. male with hx of chronic back pain and recent admission for staph bacteremia, just finished course of IV antibiotics last week, presents to ED with worsening back pain. Pt states he has chronic back issues, states while in the hospital he developed worsening back pain. He had extensive work up for his illness at that time to find the source of bacteremia. He had CT chest and abdomen and pelvis, as well as MR cervical, thoracic, lumbar spine which showed no source of infection. His MRI did show severe spinal stenosis. He is currently taking flexeril and tramadol for his pain which he states is not helping. Patient's pain is in the lower back, and shoots down bilateral legs, right worse than left. He denies any trouble urinating or controlling his bladder or bowels. He denies any abdominal pain. He denies any numbness in his legs. He states his legs do get weak sometimes. States pain is constant, but gets severe pain that "shoots in lower back and sometimes into a leg."   Past Medical History  Diagnosis Date  . Bilateral lumbar radiculopathy 06/25/2015   Past Surgical History  Procedure Laterality Date  . Partial hip arthroplasty    . Tonsillectomy     Family History  Problem Relation Age of Onset  . Lung cancer Father   . Diabetes Mother   . Neuropathy Neg Hx    Social History  Substance Use Topics  . Smoking status: Never Smoker   . Smokeless tobacco: Never Used  . Alcohol Use: No    Review of Systems  Constitutional: Positive for fever. Negative for chills.  Respiratory: Negative for cough, chest tightness and shortness of breath.   Cardiovascular: Negative for chest pain,  palpitations and leg swelling.  Gastrointestinal: Negative for nausea, vomiting, abdominal pain, diarrhea and abdominal distention.  Genitourinary: Negative for dysuria, urgency, frequency and hematuria.  Musculoskeletal: Positive for back pain and arthralgias. Negative for myalgias, neck pain and neck stiffness.  Skin: Negative for rash.  Allergic/Immunologic: Negative for immunocompromised state.  Neurological: Negative for dizziness, weakness, light-headedness, numbness and headaches.  All other systems reviewed and are negative.     Allergies  Codeine and Hydrocodone  Home Medications   Prior to Admission medications   Medication Sig Start Date End Date Taking? Authorizing Provider  acetaminophen (TYLENOL) 325 MG tablet Take 325 mg by mouth every 6 (six) hours as needed. pain   Yes Historical Provider, MD  cyclobenzaprine (FLEXERIL) 10 MG tablet Take 10 mg by mouth 3 (three) times daily. 01/03/16  Yes Historical Provider, MD  ibuprofen (ADVIL,MOTRIN) 800 MG tablet Take 800 mg by mouth every 8 (eight) hours as needed for moderate pain.    Yes Historical Provider, MD  Multiple Vitamins-Minerals (MULTIVITAMIN MEN) TABS Take 1 tablet by mouth daily.   Yes Historical Provider, MD  sodium chloride (OCEAN) 0.65 % SOLN nasal spray Place 1 spray into both nostrils as needed. Patient taking differently: Place 1 spray into both nostrils as needed for congestion.  08/24/15  Yes Junius FinnerErin O'Malley, PA-C  traMADol (ULTRAM) 50 MG tablet Take 50 mg by mouth every 6 (six) hours as needed for moderate pain.  06/05/15  Yes Historical Provider, MD  benzonatate (TESSALON) 100 MG capsule Take 1 capsule (100 mg total) by mouth every 8 (eight) hours. Patient not taking: Reported on 01/09/2016 08/24/15   Junius Finner, PA-C  meloxicam (MOBIC) 15 MG tablet Take 1 tablet (15 mg total) by mouth daily. Patient not taking: Reported on 01/09/2016 06/23/15   Anson Fret, MD  pregabalin (LYRICA) 75 MG capsule Take 1  capsule (75 mg total) by mouth 2 (two) times daily. Patient not taking: Reported on 01/09/2016 06/23/15   Anson Fret, MD   BP 98/75 mmHg  Pulse 105  Temp(Src) 98.4 F (36.9 C) (Oral)  Resp 16  Ht  (1.702 m)  Wt 67.586 kg  BMI 23.33 kg/m2  SpO2 95% Physical Exam  Constitutional: He appears well-developed and well-nourished. No distress.  HENT:  Head: Normocephalic and atraumatic.  Eyes: Conjunctivae are normal.  Neck: Neck supple.  Cardiovascular: Normal rate, regular rhythm and normal heart sounds.   Pulmonary/Chest: Effort normal. No respiratory distress. He has no wheezes. He has no rales.  Abdominal: Soft. Bowel sounds are normal. He exhibits no distension. There is no tenderness. There is no rebound.  Musculoskeletal: He exhibits no edema.  ttp over  Midline lumbar spine and right perispinal lumbar muscles. Pain with bilateral straight leg raise.   Neurological: He is alert.  5/5 and equal lower extremity strength. 2+ and equal patellar reflexes bilaterally. Pt able to dorsiflex bilateral toes and feet with good strength against resistance. Equal sensation bilaterally over thighs and lower legs.   Skin: Skin is warm and dry.  Nursing note and vitals reviewed.   ED Course  Procedures (including critical care time) Labs Review Labs Reviewed  CBC WITH DIFFERENTIAL/PLATELET - Abnormal; Notable for the following:    WBC 11.0 (*)    RBC 4.13 (*)    Hemoglobin 11.8 (*)    HCT 36.0 (*)    Neutro Abs 8.6 (*)    Monocytes Absolute 1.1 (*)    All other components within normal limits  SEDIMENTATION RATE - Abnormal; Notable for the following:    Sed Rate 61 (*)    All other components within normal limits  COMPREHENSIVE METABOLIC PANEL - Abnormal; Notable for the following:    Sodium 128 (*)    Chloride 96 (*)    Glucose, Bld 109 (*)    Calcium 8.8 (*)    Albumin 3.3 (*)    Alkaline Phosphatase 36 (*)    All other components within normal limits  C-REACTIVE  PROTEIN - Abnormal; Notable for the following:    CRP 22.8 (*)    All other components within normal limits  CULTURE, BLOOD (ROUTINE X 2)  CULTURE, BLOOD (ROUTINE X 2)  I-STAT CG4 LACTIC ACID, ED    Imaging Review No results found. I have personally reviewed and evaluated these images and lab results as part of my medical decision-making.   EKG Interpretation None      MDM   Final diagnoses:  Spinal stenosis, unspecified spinal region  Right-sided low back pain with sciatica, sciatica laterality unspecified  Hyponatremia    Pt in ED with increased back pain. Recent admission for bacterimia, at that time same pain in back and fever, blood cultures positive for staph. Finished IV antibtiocs last week, still intermittent fevers. Afebrile here. Records in Care everywhere reviewed. Pt had CT abd/pelvis/chest which was negative. Also MRI of cervical, thoracic, lumbar spine which showed severe spinal stenosis and disk disease, no evidence of infection. Same pain as  then, doubt back pain as source of infection given all this imaging. Pt has no other complaints. Will check labs and treat pain. No signs of cauda equina at this time.   4:24 PM Labs show hyponatremia, NA of 128. Hgb 11.8, which is up from hgb few weeks ago which was as long as 9 while admitted through care everywhere records. WBC improved as well. Sed rate elevated at 61, up from the one during admission, however, CRP much improved at 22.8 where it was 129 during admission. Pt feels better with tx in ED. He is afebrile. Normal VS. No evidence of infectious process based on exam and VS. Stable for dc home at this time. He has an apt with infectious disease coming up and advised him to follow up with PCP. Also will refer to neurosurgery. Return precautions discussed.   Filed Vitals:   01/09/16 1339 01/09/16 1415 01/09/16 1430 01/09/16 1445  BP: 110/72 98/75 113/68 126/75  Pulse: 107 105 102 95  Temp: 98.4 F (36.9 C)      TempSrc: Oral     Resp: 16     Height: 5\' 7"  (1.702 m)     Weight: 67.586 kg     SpO2: 96% 95% 95% 95%     Jaynie Crumbleatyana Hiren Peplinski, PA-C 01/09/16 1634  Laurence Spatesachel Morgan Little, MD 01/15/16 0011

## 2016-01-09 NOTE — ED Notes (Signed)
The pt reports that he is pain free at present

## 2016-01-09 NOTE — ED Notes (Signed)
Pt here with mid/lower back pain since being hospitalized [place] ago in [place] for [place]acte[place]. Pt reports tramadol and flexiril not helping. Pt sts he is having gait instability and increasing weakness. Pt denies loss of control of bowels or bladder,

## 2016-01-10 ENCOUNTER — Telehealth (HOSPITAL_BASED_OUTPATIENT_CLINIC_OR_DEPARTMENT_OTHER): Payer: Self-pay | Admitting: *Deleted

## 2016-01-10 ENCOUNTER — Telehealth (HOSPITAL_BASED_OUTPATIENT_CLINIC_OR_DEPARTMENT_OTHER): Payer: Self-pay | Admitting: Emergency Medicine

## 2016-01-10 LAB — BLOOD CULTURE ID PANEL (REFLEXED)
Acinetobacter baumannii: NOT DETECTED
Candida albicans: NOT DETECTED
Candida glabrata: NOT DETECTED
Candida krusei: NOT DETECTED
Candida parapsilosis: NOT DETECTED
Candida tropicalis: NOT DETECTED
Carbapenem resistance: NOT DETECTED
Enterobacter cloacae complex: NOT DETECTED
Enterobacteriaceae species: NOT DETECTED
Enterococcus species: NOT DETECTED
Escherichia coli: NOT DETECTED
Haemophilus influenzae: NOT DETECTED
Klebsiella oxytoca: NOT DETECTED
Klebsiella pneumoniae: NOT DETECTED
Listeria monocytogenes: NOT DETECTED
Methicillin resistance: NOT DETECTED
Neisseria meningitidis: NOT DETECTED
Proteus species: NOT DETECTED
Pseudomonas aeruginosa: NOT DETECTED
Serratia marcescens: NOT DETECTED
Staphylococcus aureus (BCID): DETECTED — AB
Staphylococcus species: DETECTED — AB
Streptococcus agalactiae: NOT DETECTED
Streptococcus pneumoniae: NOT DETECTED
Streptococcus pyogenes: NOT DETECTED
Streptococcus species: NOT DETECTED
Vancomycin resistance: NOT DETECTED

## 2016-01-10 NOTE — Telephone Encounter (Signed)
(+)   blood culture results called to patient with recommendation from Althea GrimmerSamantha Riley, PA to return to ED for further evaluation.  States he will contact Dr. Weston AnnaMichael Arapian [phone]) who has been treating him for prior infection to inform of (+) blood culture and will seek treatment based on Dr. Peggye FothergillArapian's advise.

## 2016-01-10 NOTE — Telephone Encounter (Signed)
Chart handoff to EDP Althea GrimmerSamantha Riley PA, instructed to call patient to return to ER for reeval

## 2016-01-12 LAB — CULTURE, BLOOD (ROUTINE X 2)

## 2016-01-13 ENCOUNTER — Telehealth (HOSPITAL_BASED_OUTPATIENT_CLINIC_OR_DEPARTMENT_OTHER): Payer: Self-pay

## 2016-01-13 NOTE — Telephone Encounter (Signed)
Post ED Visit - Positive Culture Follow-up  Culture report reviewed by antimicrobial stewardship pharmacist:  []  Enzo BiNathan Batchelder, Pharm.D. []  Celedonio MiyamotoJeremy Frens, Pharm.D., BCPS []  Garvin FilaMike Maccia, Pharm.D. []  Georgina PillionElizabeth Martin, Pharm.D., BCPS []  St. MichaelMinh Pham, [address]Ph[address]D., BCPS, AAHIVP []  Estella HuskMichelle Turner, Pharm.D., BCPS, AAHIVP []  Tennis Mustassie Stewart, Pharm.D. []  Sherle Poeob Vincent, [address]Ph[address]Novella Rob. Jennifer Marple Pharm D Positive blood culture Already notified on 01/10/16 and no further patient follow-up is required at this time.  Jerry CarasCullom, Hennie Gosa Burnett 01/13/2016, 10:43 AM

## 2017-01-02 IMAGING — MR MR LUMBAR SPINE W/O CM
4 of 5 series · 24 of 48 positions shown · non-contrast
Comparison: Left hip series demonstrating arthroplasty changes
04/13/2014.

CLINICAL DATA: 59-year-old male with lumbar back pain radiating to
both lower extremities as far as the feet. Numbness and tingling.
Symptoms increased with standing, and progressed for the past 3
weeks. Initial encounter.

EXAM:
MRI LUMBAR SPINE WITHOUT CONTRAST
TECHNIQUE: Multiplanar, multisequence MR imaging of the lumbar spine was
performed. No intravenous contrast was administered.

[Series 2: T2 · sagittal · 4.0mm · 0.81mm/px · 7 of 15 slices shown (1 of 2)]
[im 1/15]
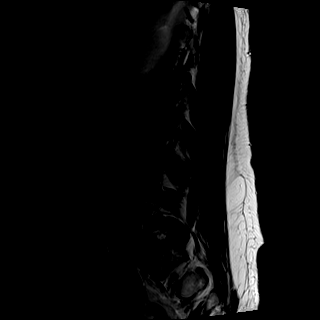
[im 3/15]
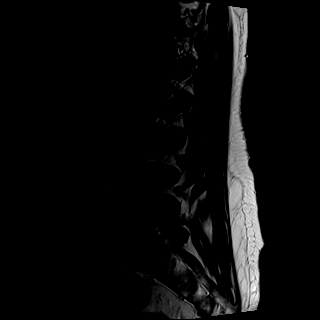
[im 5/15]
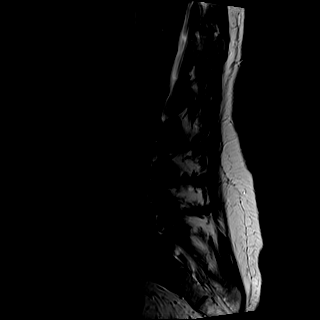
[im 8/15]
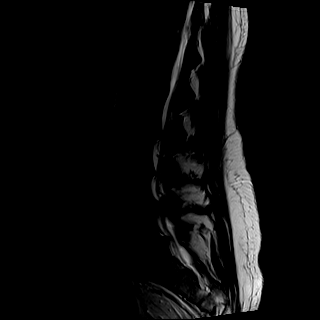
[im 10/15]
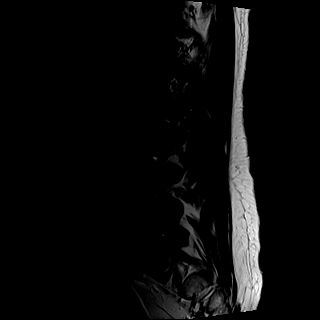
[im 12/15]
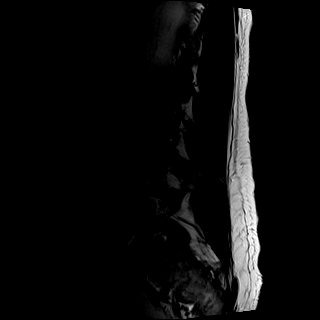
[im 15/15]
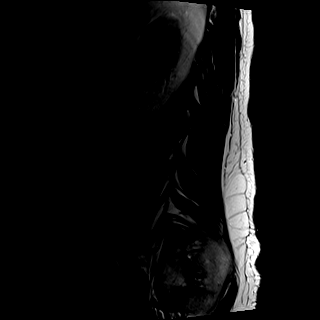

[Series 3: T1 · sagittal · 4.0mm · 0.41mm/px · 6 of 15 slices shown (1 of 2)]
[im 1/15]
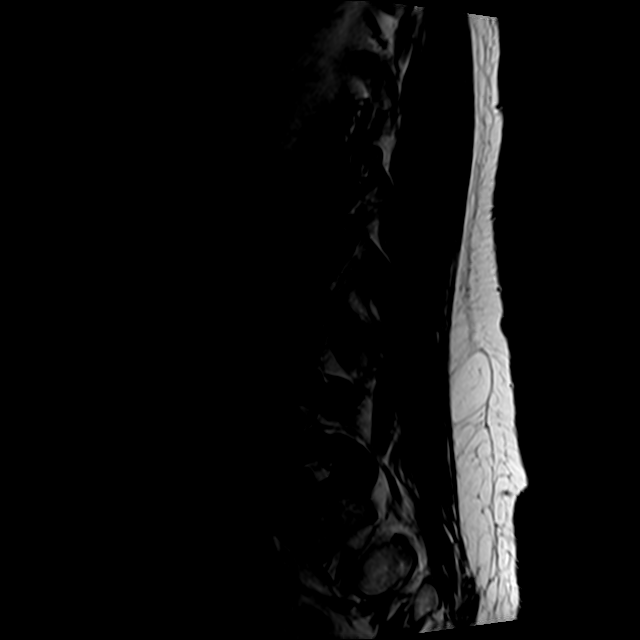
[im 3/15]
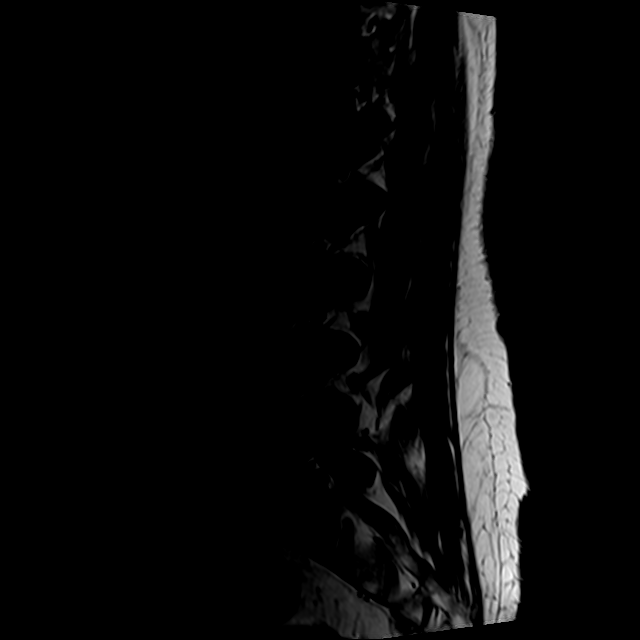
[im 5/15]
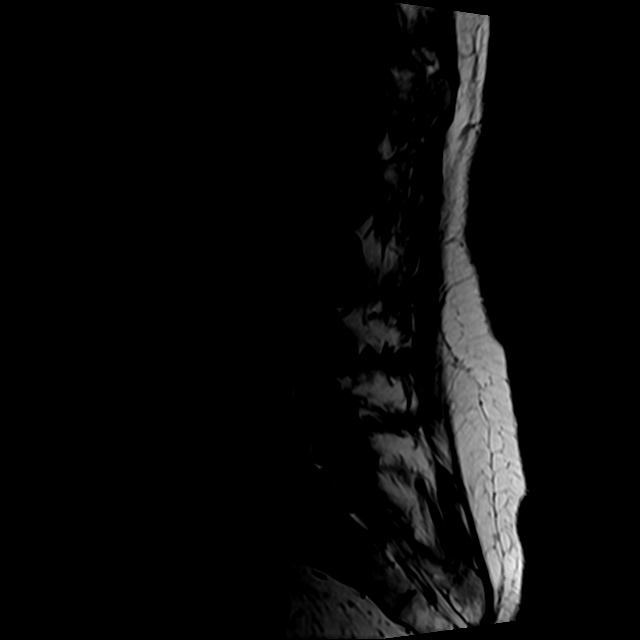
[im 8/15]
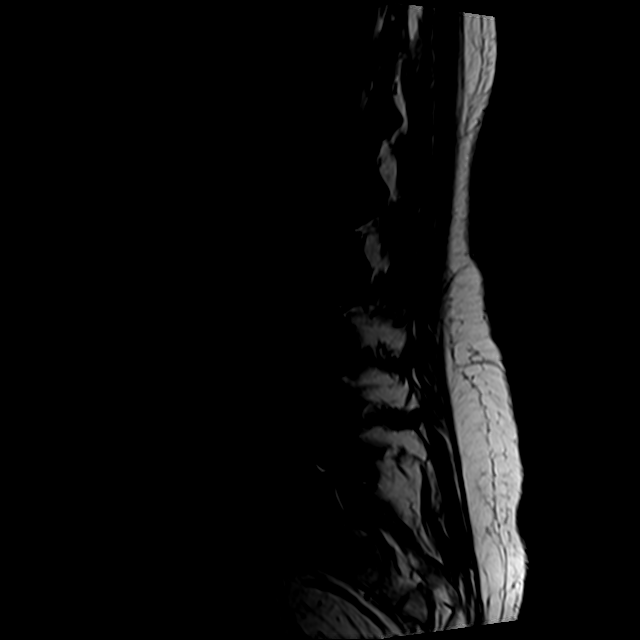
[im 10/15]
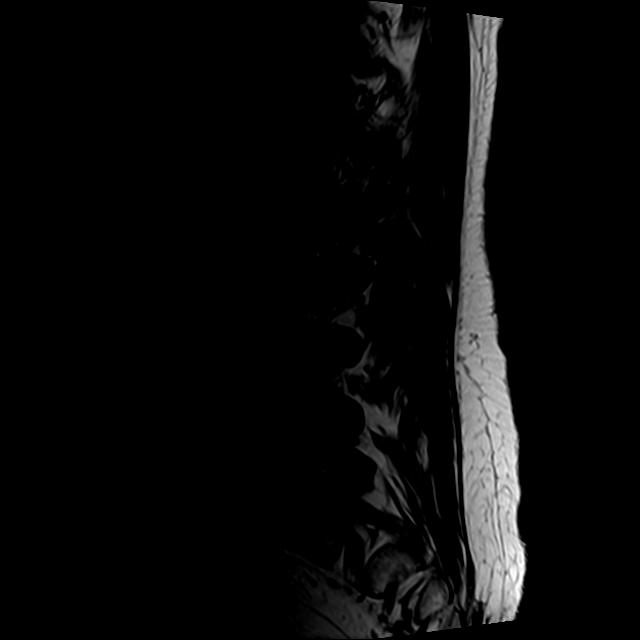
[im 12/15]
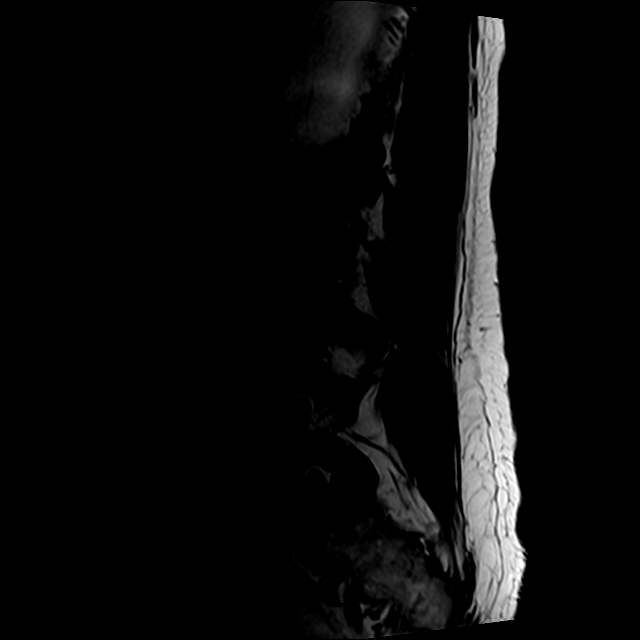

[Series 6: T1 · axial · 4.0mm · 0.31mm/px · z∈[-28,+123]mm · 3 of 33 slices shown (2 of 2)]
[im 5/33]
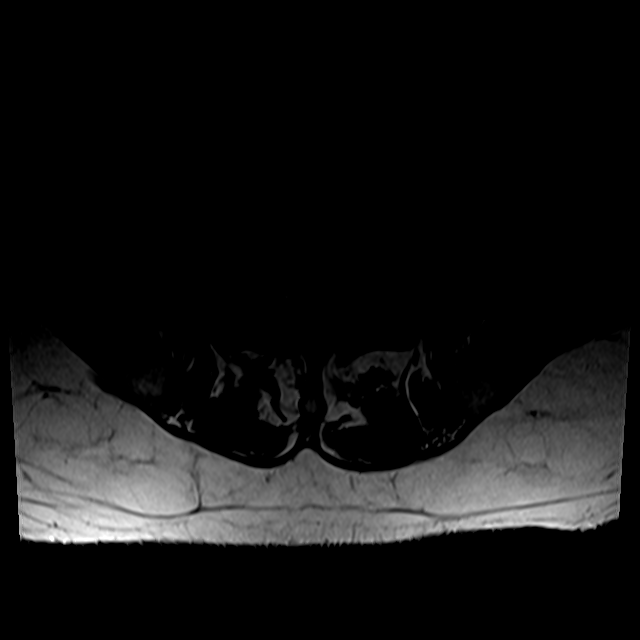
[im 18/33]
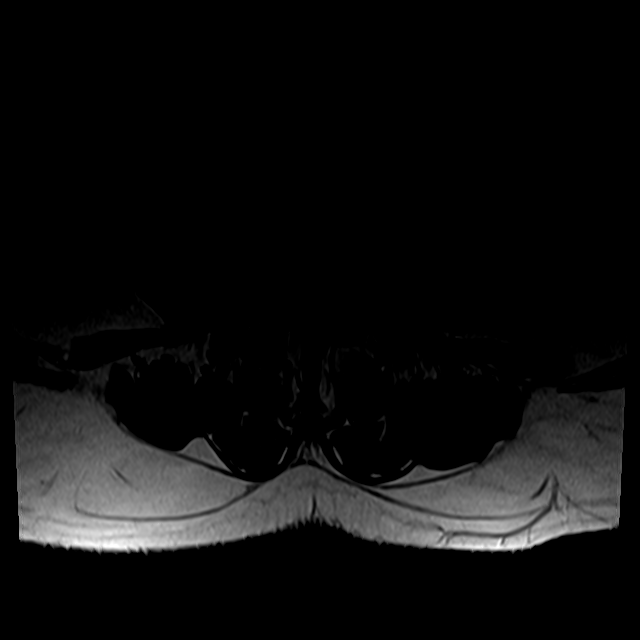
[im 28/33]
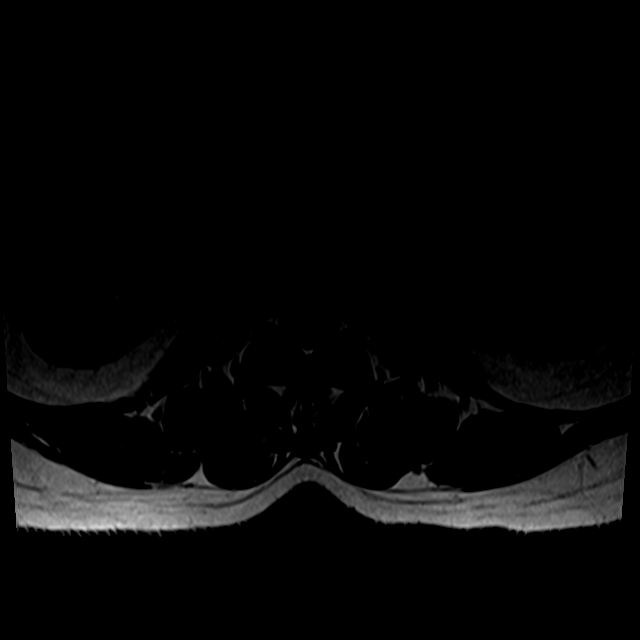

[Series 7: T2 · axial · 4.0mm · 0.78mm/px · z∈[-47,+148]mm · 8 of 33 slices shown (2 of 2)]
[im 1/33]
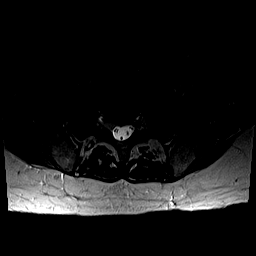
[im 5/33]
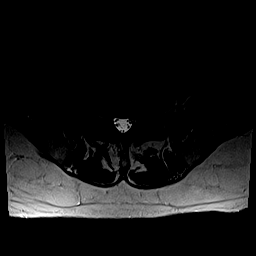
[im 10/33]
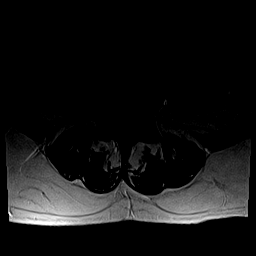
[im 15/33]
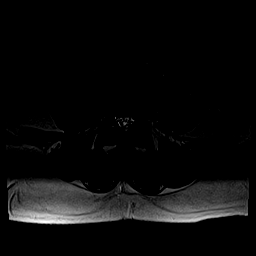
[im 18/33]
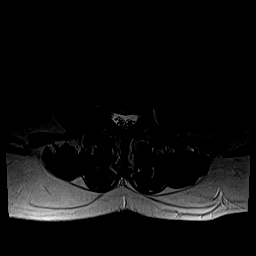
[im 23/33]
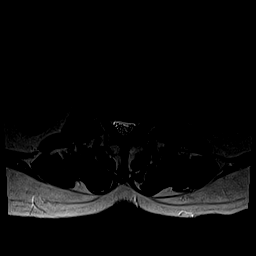
[im 28/33]
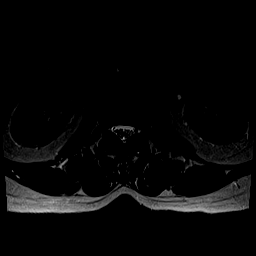
[im 33/33]
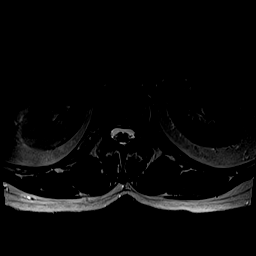

[24 of 48 positions shown; findings below may reference images not displayed]

FINDINGS: Lumbar segmentation appears to be normal and will be designated as
such for this report. Trace retrolisthesis at L2-L3. Minimal right
posterior lateral endplate marrow edema at L4-L5 and L5-S1. No acute
osseous abnormality identified.

Visualized lower thoracic spinal cord is normal with conus medularis
at L1-L2.

Visualized abdominal viscera and paraspinal soft tissues are within
normal limits.

There is a a degree of congenital spinal canal narrowing diffusely.
The following superimposed degenerative changes are noted:

T11-T12:  Mild right facet hypertrophy.

T12-L1:  Negative.

L1-L2:  Minimal disc bulge and facet hypertrophy.

L2-L3: Disc desiccation. Circumferential disc osteophyte complex
with mild retrolisthesis. Minimal to mild facet hypertrophy. Overall
mild to moderate spinal stenosis. Mild left greater than right L2
foraminal stenosis primarily related to foraminal disc.

L3-L4: Disc desiccation and circumferential disc bulge. Broad-based
posterior component of disc. Mild facet hypertrophy. Mild spinal
stenosis. Mild bilateral L3 foraminal stenosis primarily due to
foraminal component of disc.

L4-L5: Disc desiccation. Circumferential disc bulge with left
eccentric broad-based superimposed central disc protrusion (series
7, image 23). Mild to moderate facet and ligament flavum
hypertrophy. Severe spinal and left greater than right lateral
recess stenosis. Mild to moderate bilateral L4 foraminal stenosis.

L5-S1: Vacuum disc. Disc space loss. Circumferential disc osteophyte
complex. Mild facet hypertrophy. No spinal or lateral recess
stenosis. Mild to moderate left and mild right L5 foraminal stenosis
primarily due to endplate spurring.
IMPRESSION: 1. Combined congenital and acquired lumbar spinal stenosis is severe
at L4-L5, mild to moderate at L2-L3, and mild at L3-L4. Chronic
disc, endplate, and posterior element degeneration at those levels.
2. Widespread mild to moderate multifactorial lumbar foraminal
stenosis.
